# Patient Record
Sex: Female | Born: 1955 | Race: Black or African American | Hispanic: No | Marital: Single | Smoking: Never smoker
Health system: Southern US, Community
[De-identification: ages and names within clinical notes are randomized; demographics above are authoritative.]

## PROBLEM LIST (undated history)

## (undated) DIAGNOSIS — E785 Hyperlipidemia, unspecified: Secondary | ICD-10-CM

## (undated) DIAGNOSIS — I2510 Atherosclerotic heart disease of native coronary artery without angina pectoris: Secondary | ICD-10-CM

## (undated) DIAGNOSIS — I252 Old myocardial infarction: Secondary | ICD-10-CM

## (undated) DIAGNOSIS — I10 Essential (primary) hypertension: Secondary | ICD-10-CM

## (undated) HISTORY — PX: TUBAL LIGATION: SHX77

---

## 2008-09-21 ENCOUNTER — Emergency Department (HOSPITAL_BASED_OUTPATIENT_CLINIC_OR_DEPARTMENT_OTHER): Admission: EM | Admit: 2008-09-21 | Discharge: 2008-09-21 | Payer: Self-pay | Admitting: Emergency Medicine

## 2008-12-03 ENCOUNTER — Emergency Department (HOSPITAL_BASED_OUTPATIENT_CLINIC_OR_DEPARTMENT_OTHER): Admission: EM | Admit: 2008-12-03 | Discharge: 2008-12-03 | Payer: Self-pay | Admitting: Emergency Medicine

## 2008-12-03 ENCOUNTER — Ambulatory Visit: Payer: Self-pay | Admitting: Interventional Radiology

## 2010-11-25 LAB — DIFFERENTIAL
Basophils Absolute: 0 10*3/uL (ref 0.0–0.1)
Basophils Relative: 0 % (ref 0–1)
Eosinophils Absolute: 0.1 10*3/uL (ref 0.0–0.7)
Monocytes Relative: 8 % (ref 3–12)
Neutro Abs: 4 10*3/uL (ref 1.7–7.7)
Neutrophils Relative %: 73 % (ref 43–77)

## 2010-11-25 LAB — BASIC METABOLIC PANEL
BUN: 9 mg/dL (ref 6–23)
CO2: 29 mEq/L (ref 19–32)
Calcium: 9.6 mg/dL (ref 8.4–10.5)
Creatinine, Ser: 0.8 mg/dL (ref 0.4–1.2)
GFR calc Af Amer: >60 mL/min (ref >60)

## 2010-11-25 LAB — POCT TOXICOLOGY PANEL

## 2010-11-25 LAB — CBC
MCHC: 32.5 g/dL (ref 30.0–36.0)
MCV: 93.1 fL (ref 78.0–100.0)
Platelets: 167 10*3/uL (ref 150–400)
RBC: 4.24 MIL/uL (ref 3.87–5.11)

## 2010-11-25 LAB — URINALYSIS, ROUTINE W REFLEX MICROSCOPIC
Bilirubin Urine: NEGATIVE
Glucose, UA: NEGATIVE mg/dL
Hgb urine dipstick: NEGATIVE
Ketones, ur: NEGATIVE mg/dL
Protein, ur: NEGATIVE mg/dL
Urobilinogen, UA: 0.2 mg/dL (ref 0.0–1.0)

## 2010-11-25 LAB — GLUCOSE, CAPILLARY: Glucose-Capillary: 82 mg/dL (ref 70–99)

## 2010-11-25 LAB — POCT CARDIAC MARKERS
CKMB, poc: <1 ng/mL — ABNORMAL LOW (ref 1.0–8.0)
Myoglobin, poc: 74.6 ng/mL (ref 12–200)

## 2013-08-15 ENCOUNTER — Emergency Department (HOSPITAL_BASED_OUTPATIENT_CLINIC_OR_DEPARTMENT_OTHER)
Admission: EM | Admit: 2013-08-15 | Discharge: 2013-08-16 | Disposition: A | Discharge status: Home / Self Care | Payer: No Typology Code available for payment source | Attending: Emergency Medicine | Admitting: Emergency Medicine

## 2013-08-15 ENCOUNTER — Encounter (HOSPITAL_BASED_OUTPATIENT_CLINIC_OR_DEPARTMENT_OTHER): Payer: Self-pay | Admitting: Emergency Medicine

## 2013-08-15 DIAGNOSIS — M5380 Other specified dorsopathies, site unspecified: Secondary | ICD-10-CM | POA: Insufficient documentation

## 2013-08-15 DIAGNOSIS — E785 Hyperlipidemia, unspecified: Secondary | ICD-10-CM | POA: Insufficient documentation

## 2013-08-15 DIAGNOSIS — I252 Old myocardial infarction: Secondary | ICD-10-CM | POA: Insufficient documentation

## 2013-08-15 DIAGNOSIS — Y9389 Activity, other specified: Secondary | ICD-10-CM | POA: Insufficient documentation

## 2013-08-15 DIAGNOSIS — T148XXA Other injury of unspecified body region, initial encounter: Secondary | ICD-10-CM

## 2013-08-15 DIAGNOSIS — IMO0002 Reserved for concepts with insufficient information to code with codable children: Secondary | ICD-10-CM | POA: Insufficient documentation

## 2013-08-15 DIAGNOSIS — Y92410 Unspecified street and highway as the place of occurrence of the external cause: Secondary | ICD-10-CM | POA: Insufficient documentation

## 2013-08-15 DIAGNOSIS — I2510 Atherosclerotic heart disease of native coronary artery without angina pectoris: Secondary | ICD-10-CM | POA: Insufficient documentation

## 2013-08-15 DIAGNOSIS — I10 Essential (primary) hypertension: Secondary | ICD-10-CM | POA: Insufficient documentation

## 2013-08-15 DIAGNOSIS — Z79899 Other long term (current) drug therapy: Secondary | ICD-10-CM | POA: Insufficient documentation

## 2013-08-15 HISTORY — DX: Old myocardial infarction: I25.2

## 2013-08-15 HISTORY — DX: Essential (primary) hypertension: I10

## 2013-08-15 HISTORY — DX: Atherosclerotic heart disease of native coronary artery without angina pectoris: I25.10

## 2013-08-15 HISTORY — DX: Hyperlipidemia, unspecified: E78.5

## 2013-08-15 MED ORDER — TRAMADOL HCL 50 MG PO TABS
50.0000 mg | ORAL_TABLET | Freq: Once | ORAL | Status: AC
  Administered 2013-08-15: 50 mg via ORAL
  Filled 2013-08-15: qty 1

## 2013-08-15 MED ORDER — METHOCARBAMOL 500 MG PO TABS
1000.0000 mg | ORAL_TABLET | Freq: Once | ORAL | Status: AC
  Administered 2013-08-15: 1000 mg via ORAL
  Filled 2013-08-15: qty 2

## 2013-08-15 MED ORDER — METHOCARBAMOL 500 MG PO TABS: 500.0000 mg | ORAL_TABLET | Freq: Two times a day (BID) | ORAL | Status: DC

## 2013-08-15 MED ORDER — TRAMADOL HCL 50 MG PO TABS: 50.0000 mg | ORAL_TABLET | Freq: Four times a day (QID) | ORAL | Status: DC | PRN
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
## 2013-08-16 MED ORDER — TRAMADOL HCL 50 MG PO TABS: 50.0000 mg | ORAL_TABLET | Freq: Four times a day (QID) | ORAL | Status: DC | PRN

## 2013-08-16 MED ORDER — METHOCARBAMOL 500 MG PO TABS: 500.0000 mg | ORAL_TABLET | Freq: Two times a day (BID) | ORAL | Status: DC

## 2013-11-30 ENCOUNTER — Emergency Department (HOSPITAL_COMMUNITY)
Admission: EM | Admit: 2013-11-30 | Discharge: 2013-11-30 | Disposition: A | Discharge status: Home / Self Care | Payer: Federal, State, Local not specified - PPO | Attending: Emergency Medicine | Admitting: Emergency Medicine

## 2013-11-30 ENCOUNTER — Emergency Department (HOSPITAL_COMMUNITY): Payer: Federal, State, Local not specified - PPO

## 2013-11-30 ENCOUNTER — Encounter (HOSPITAL_COMMUNITY): Payer: Self-pay | Admitting: Emergency Medicine

## 2013-11-30 DIAGNOSIS — R42 Dizziness and giddiness: Secondary | ICD-10-CM | POA: Insufficient documentation

## 2013-11-30 DIAGNOSIS — Z862 Personal history of diseases of the blood and blood-forming organs and certain disorders involving the immune mechanism: Secondary | ICD-10-CM | POA: Insufficient documentation

## 2013-11-30 DIAGNOSIS — Z79899 Other long term (current) drug therapy: Secondary | ICD-10-CM | POA: Insufficient documentation

## 2013-11-30 DIAGNOSIS — Z8639 Personal history of other endocrine, nutritional and metabolic disease: Secondary | ICD-10-CM | POA: Insufficient documentation

## 2013-11-30 DIAGNOSIS — I10 Essential (primary) hypertension: Secondary | ICD-10-CM | POA: Insufficient documentation

## 2013-11-30 DIAGNOSIS — R0602 Shortness of breath: Secondary | ICD-10-CM | POA: Insufficient documentation

## 2013-11-30 DIAGNOSIS — I252 Old myocardial infarction: Secondary | ICD-10-CM | POA: Insufficient documentation

## 2013-11-30 DIAGNOSIS — I2510 Atherosclerotic heart disease of native coronary artery without angina pectoris: Secondary | ICD-10-CM | POA: Insufficient documentation

## 2013-11-30 LAB — CBC WITH DIFFERENTIAL/PLATELET
Basophils Absolute: 0 10*3/uL (ref 0.0–0.1)
Basophils Relative: 0 % (ref 0–1)
EOS ABS: 0.1 10*3/uL (ref 0.0–0.7)
Eosinophils Relative: 2 % (ref 0–5)
HCT: 36.4 % (ref 36.0–46.0)
HEMOGLOBIN: 11.8 g/dL — AB (ref 12.0–15.0)
LYMPHS ABS: 1.3 10*3/uL (ref 0.7–4.0)
Lymphocytes Relative: 24 % (ref 12–46)
MCH: 29.1 pg (ref 26.0–34.0)
MCHC: 32.4 g/dL (ref 30.0–36.0)
MCV: 89.7 fL (ref 78.0–100.0)
MONOS PCT: 7 % (ref 3–12)
Monocytes Absolute: 0.4 10*3/uL (ref 0.1–1.0)
NEUTROS PCT: 67 % (ref 43–77)
Neutro Abs: 3.7 10*3/uL (ref 1.7–7.7)
Platelets: 231 10*3/uL (ref 150–400)
RBC: 4.06 MIL/uL (ref 3.87–5.11)
RDW: 13.9 % (ref 11.5–15.5)
WBC: 5.5 10*3/uL (ref 4.0–10.5)

## 2013-11-30 LAB — BASIC METABOLIC PANEL
BUN: 14 mg/dL (ref 6–23)
CO2: 22 meq/L (ref 19–32)
Calcium: 9.5 mg/dL (ref 8.4–10.5)
Chloride: 105 mEq/L (ref 96–112)
Creatinine, Ser: 0.68 mg/dL (ref 0.50–1.10)
GFR calc Af Amer: >90 mL/min (ref >90)
GLUCOSE: 105 mg/dL — AB (ref 70–99)
POTASSIUM: 3.8 meq/L (ref 3.7–5.3)
Sodium: 143 mEq/L (ref 137–147)

## 2013-11-30 LAB — I-STAT TROPONIN, ED
TROPONIN I, POC: 0 ng/mL (ref 0.00–0.08)
Troponin i, poc: 0 ng/mL (ref 0.00–0.08)

## 2013-11-30 MED ORDER — ASPIRIN 81 MG PO CHEW: 243.0000 mg | CHEWABLE_TABLET | Freq: Once | ORAL | Status: DC

## 2013-11-30 NOTE — ED Notes (Signed)
Phlebotomy stick attempted in right hand without success.

## 2013-11-30 NOTE — ED Notes (Signed)
PA at bedside.

## 2013-11-30 NOTE — ED Notes (Signed)
Per ems, pt was sitting at work, had sudden onset of SOB. Dizziness when standing. Pt hax hx of asthma. Pt used inhaler twice. Upon ems arrival, symptoms have resolved. Pt deneis CP. Pt currently denies SOB at this time. Denies pain. Pt hx of MI, states concern for similar symptoms. Pt in NAD, symoptom free upon arrival.

## 2013-11-30 NOTE — Discharge Instructions (Signed)
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
## 2013-11-30 NOTE — ED Notes (Signed)
Phlebotomy called.  

## 2013-12-02 NOTE — ED Provider Notes (Signed)
Medical screening examination/treatment/procedure(s) were performed by non-physician practitioner and as supervising physician I was immediately available for consultation/collaboration.   EKG Interpretation   Date/Time:  Friday November 30 2013 18:37:30 EDT Ventricular Rate:  75 PR Interval:  168 QRS Duration: 77 QT Interval:  396 QTC Calculation: 442 R Axis:   21 Text Interpretation:  Sinus rhythm Ventricular premature complex ED  PHYSICIAN INTERPRETATION AVAILABLE IN [facility] Confirmed by TEST,  Record ([id number]) on 12/02/2013 1:23:41 PM        Shelda JakesScott W. Gerilyn Stargell, MD 12/02/13 1539

## 2015-02-24 ENCOUNTER — Emergency Department (HOSPITAL_COMMUNITY): Payer: Federal, State, Local not specified - PPO

## 2015-02-24 ENCOUNTER — Emergency Department (HOSPITAL_COMMUNITY)
Admission: EM | Admit: 2015-02-24 | Discharge: 2015-02-24 | Disposition: A | Discharge status: Home / Self Care | Payer: Federal, State, Local not specified - PPO | Attending: Emergency Medicine | Admitting: Emergency Medicine

## 2015-02-24 ENCOUNTER — Encounter (HOSPITAL_COMMUNITY): Payer: Self-pay | Admitting: Nurse Practitioner

## 2015-02-24 DIAGNOSIS — E785 Hyperlipidemia, unspecified: Secondary | ICD-10-CM | POA: Insufficient documentation

## 2015-02-24 DIAGNOSIS — I252 Old myocardial infarction: Secondary | ICD-10-CM | POA: Insufficient documentation

## 2015-02-24 DIAGNOSIS — Z79899 Other long term (current) drug therapy: Secondary | ICD-10-CM | POA: Insufficient documentation

## 2015-02-24 DIAGNOSIS — Z7982 Long term (current) use of aspirin: Secondary | ICD-10-CM | POA: Insufficient documentation

## 2015-02-24 DIAGNOSIS — I10 Essential (primary) hypertension: Secondary | ICD-10-CM | POA: Insufficient documentation

## 2015-02-24 DIAGNOSIS — I2510 Atherosclerotic heart disease of native coronary artery without angina pectoris: Secondary | ICD-10-CM | POA: Diagnosis not present

## 2015-02-24 DIAGNOSIS — R202 Paresthesia of skin: Secondary | ICD-10-CM | POA: Diagnosis not present

## 2015-02-24 DIAGNOSIS — R200 Anesthesia of skin: Secondary | ICD-10-CM | POA: Diagnosis present

## 2015-02-24 LAB — CBC
HEMATOCRIT: 39.6 % (ref 36.0–46.0)
Hemoglobin: 12.9 g/dL (ref 12.0–15.0)
MCH: 28.6 pg (ref 26.0–34.0)
MCHC: 32.6 g/dL (ref 30.0–36.0)
MCV: 87.8 fL (ref 78.0–100.0)
Platelets: 214 10*3/uL (ref 150–400)
RBC: 4.51 MIL/uL (ref 3.87–5.11)
RDW: 13.3 % (ref 11.5–15.5)
WBC: 5 10*3/uL (ref 4.0–10.5)

## 2015-02-24 LAB — BASIC METABOLIC PANEL
Anion gap: 10 (ref 5–15)
BUN: 12 mg/dL (ref 6–20)
CALCIUM: 9.5 mg/dL (ref 8.9–10.3)
CO2: 25 mmol/L (ref 22–32)
Chloride: 107 mmol/L (ref 101–111)
Creatinine, Ser: 0.73 mg/dL (ref 0.44–1.00)
GFR calc Af Amer: >60 mL/min (ref >60)
GFR calc non Af Amer: >60 mL/min (ref >60)
GLUCOSE: 89 mg/dL (ref 65–99)
Potassium: 4 mmol/L (ref 3.5–5.1)
Sodium: 142 mmol/L (ref 135–145)

## 2015-02-24 LAB — I-STAT TROPONIN, ED: Troponin i, poc: 0 ng/mL (ref 0.00–0.08)

## 2015-02-24 NOTE — Discharge Instructions (Signed)
Start taking your vitamin B12 tablets again. Stay well hydrated. Follow up with your regular doctor in 3-5 days for recheck of ongoing symptoms. Return to the ER for changes or worsening symptoms.   Paresthesia Paresthesia is an abnormal burning or prickling sensation. This sensation is generally felt in the hands, arms, legs, or feet. However, it may occur in any part of the body. It is usually not painful. The feeling may be described as:  Tingling or numbness.  "Pins and needles."  Skin crawling.  Buzzing.  Limbs "falling asleep."  Itching. Most people experience temporary (transient) paresthesia at some time in their lives. CAUSES  Paresthesia may occur when you breathe too quickly (hyperventilation). It can also occur without any apparent cause. Commonly, paresthesia occurs when pressure is placed on a nerve. The feeling quickly goes away once the pressure is removed. For some people, however, paresthesia is a long-lasting (chronic) condition caused by an underlying disorder. The underlying disorder may be:  A traumatic, direct injury to nerves. Examples include a:  Broken (fractured) neck.  Fractured skull.  A disorder affecting the brain and spinal cord (central nervous system). Examples include:  Transverse myelitis.  Encephalitis.  Transient ischemic attack.  Multiple sclerosis.  Stroke.  Tumor or blood vessel problems, such as an arteriovenous malformation pressing against the brain or spinal cord.  A condition that damages the peripheral nerves (peripheral neuropathy). Peripheral nerves are not part of the brain and spinal cord. These conditions include:  Diabetes.  Peripheral vascular disease.  Nerve entrapment syndromes, such as carpal tunnel syndrome.  Shingles.  Hypothyroidism.  Vitamin B12 deficiencies.  Alcoholism.  Heavy metal poisoning (lead, arsenic).  Rheumatoid arthritis.  Systemic lupus erythematosus. DIAGNOSIS  Your caregiver will  attempt to find the underlying cause of your paresthesia. Your caregiver may:  Take your medical history.  Perform a physical exam.  Order various lab tests.  Order imaging tests. TREATMENT  Treatment for paresthesia depends on the underlying cause. HOME CARE INSTRUCTIONS  Avoid drinking alcohol.  You may consider massage or acupuncture to help relieve your symptoms.  Keep all follow-up appointments as directed by your caregiver. SEEK IMMEDIATE MEDICAL CARE IF:   You feel weak.  You have trouble walking or moving.  You have problems with speech or vision.  You feel confused.  You cannot control your bladder or bowel movements.  You feel numbness after an injury.  You faint.  Your burning or prickling feeling gets worse when walking.  You have pain, cramps, or dizziness.  You develop a rash. MAKE SURE YOU:  Understand these instructions.  Will watch your condition.  Will get help right away if you are not doing well or get worse. Document Released: 07/23/2002 Document Revised: 10/25/2011 Document Reviewed: 04/23/2011 [facility] Patient Information 2015 [place], [place]. This information is not intended to replace advice given to you by your health care provider. Make sure you discuss any questions you have with your health care provider.  Peripheral Neuropathy Peripheral neuropathy is a type of nerve damage. It affects nerves that carry signals between the spinal cord and other parts of the body. These are called peripheral nerves. With peripheral neuropathy, one nerve or a group of nerves may be damaged.  CAUSES  Many things can damage peripheral nerves. For some people with peripheral neuropathy, the cause is unknown. Some causes include:  Diabetes. This is the most common cause of peripheral neuropathy.  Injury to a nerve.  Pressure or stress on a nerve that  lasts a long time.  Too little vitamin B. Alcoholism can lead to this.  Infections.  Autoimmune  diseases, such as multiple sclerosis and systemic lupus erythematosus.  Inherited nerve diseases.  Some medicines, such as cancer drugs.  Toxic substances, such as lead and mercury.  Too little blood flowing to the legs.  Kidney disease.  Thyroid disease. SIGNS AND SYMPTOMS  Different people have different symptoms. The symptoms you have will depend on which of your nerves is damaged. Common symptoms include:  Loss of feeling (numbness) in the feet and hands.  Tingling in the feet and hands.  Pain that burns.  Very sensitive skin.  Weakness.  Not being able to move a part of the body (paralysis).  Muscle twitching.  Clumsiness or poor coordination.  Loss of balance.  Not being able to control your bladder.  Feeling dizzy.  Sexual problems. DIAGNOSIS  Peripheral neuropathy is a symptom, not a disease. Finding the cause of peripheral neuropathy can be hard. To figure that out, your health care provider will take a medical history and do a physical exam. A neurological exam will also be done. This involves checking things affected by your brain, spinal cord, and nerves (nervous system). For example, your health care provider will check your reflexes, how you move, and what you can feel.  Other types of tests may also be ordered, such as:  Blood tests.  A test of the fluid in your spinal cord.  Imaging tests, such as CT scans or an MRI.  Electromyography (EMG). This test checks the nerves that control muscles.  Nerve conduction velocity tests. These tests check how fast messages pass through your nerves.  Nerve biopsy. A small piece of nerve is removed. It is then checked under a microscope. TREATMENT   Medicine is often used to treat peripheral neuropathy. Medicines may include:  Pain-relieving medicines. Prescription or over-the-counter medicine may be suggested.  Antiseizure medicine. This may be used for pain.  Antidepressants. These also may help ease  pain from neuropathy.  Lidocaine. This is a numbing medicine. You might wear a patch or be given a shot.  Mexiletine. This medicine is typically used to help control irregular heart rhythms.  Surgery. Surgery may be needed to relieve pressure on a nerve or to destroy a nerve that is causing pain.  Physical therapy to help movement.  Assistive devices to help movement. HOME CARE INSTRUCTIONS   Only take over-the-counter or prescription medicines as directed by your health care provider. Follow the instructions carefully for any given medicines. Do not take any other medicines without first getting approval from your health care provider.  If you have diabetes, work closely with your health care provider to keep your blood sugar under control.  If you have numbness in your feet:  Check every day for signs of injury or infection. Watch for redness, warmth, and swelling.  Wear padded socks and comfortable shoes. These help protect your feet.  Do not do things that put pressure on your damaged nerve.  Do not smoke. Smoking keeps blood from getting to damaged nerves.  Avoid or limit alcohol. Too much alcohol can cause a lack of B vitamins. These vitamins are needed for healthy nerves.  Develop a good support system. Coping with peripheral neuropathy can be stressful. Talk to a mental health specialist or join a support group if you are struggling.  Follow up with your health care provider as directed. SEEK MEDICAL CARE IF:   You have new  signs or symptoms of peripheral neuropathy.  You are struggling emotionally from dealing with peripheral neuropathy.  You have a fever. SEEK IMMEDIATE MEDICAL CARE IF:   You have an injury or infection that is not healing.  You feel very dizzy or begin vomiting.  You have chest pain.  You have trouble breathing. Document Released: 07/23/2002 Document Revised: 04/14/2011 Document Reviewed: 04/09/2013 [facility] Patient Information 2015  [place], [place]. This information is not intended to replace advice given to you by your health care provider. Make sure you discuss any questions you have with your health care provider.

## 2015-02-24 NOTE — ED Notes (Signed)
Pt. Ambulated independently in hallway with steady gait, no complaints of pain or dizziness.

## 2015-02-24 NOTE — ED Notes (Signed)
Pt states she woke this am with tingling sensation in L hand and BLE, the sensation has persisted through the day. She denies pain now. She did have lower back pain last night before going to bed. She had lower back pain with MI in the past. She is A&Ox4, resp e/u

## 2015-02-24 NOTE — ED Notes (Signed)
PA Mercedes at bedside with patient and family.

## 2015-02-24 NOTE — ED Provider Notes (Signed)
CSN: [id number]     Arrival date & time 02/24/15  1233 History   First MD Initiated Contact with Patient 02/24/15 1307     Chief Complaint  Patient presents with  . Numbness     (Consider location/radiation/quality/duration/timing/severity/associated sxs/prior Treatment) HPI Comments: Kristin Schneider is a 59 y.o. female with a PMHx of CAD, MI, HLD, and HTN, who presents to the ED with complaints of bilateral lower extremity tingling and left finger tip paresthesia with a "funny feeling" in her left face that began upon awakening this morning around 5 AM. She states that last night she had some low back pain which resolved during the course of the night, but she awoke with the new symptoms. She denies any fevers, chills, chest pain, shortness breath, cough, leg swelling, recent travel/surgery/immobilization, history of DVT/PE, abdominal pain, nausea, vomiting, diarrhea, constipation, obstipation, melena, hematochezia, dysuria, hematuria, perianal numbness or tingling, cauda equina symptoms, incontinence of urine or stool, focal weakness, diaphoresis, lightheadedness, headache, or vision changes. She is a nonsmoker. No recent changes in medications but she admits that she hasn't been compliant with her vitamin B12.  Patient is a 59 y.o. female presenting with general illness. The history is provided by the patient. No language interpreter was used.  Illness Location:  BLE tingling, L fingertips paresthesia, and "funny feeling" in L face Quality:  Tingling/paresthesia Severity:  Moderate Onset quality:  Gradual Duration:  8 hours Timing:  Constant Progression:  Unchanged Chronicity:  New Context:  Awoke with symptoms Relieved by:  None tried Worsened by:  None tried Ineffective treatments:  None tried Associated symptoms: no abdominal pain, no chest pain, no cough, no diarrhea, no fever, no headaches, no myalgias, no nausea, no shortness of breath and no vomiting     Past Medical History   Diagnosis Date  . Coronary artery disease   . MI, old   . Hyperlipemia   . Hypertension    Past Surgical History  Procedure Laterality Date  . Tubal ligation     History reviewed. No pertinent family history. History  Substance Use Topics  . Smoking status: Never Smoker   . Smokeless tobacco: Not on file  . Alcohol Use: No   OB History    No data available     Review of Systems  Constitutional: Negative for fever, chills and diaphoresis.  Eyes: Negative for visual disturbance.  Respiratory: Negative for cough and shortness of breath.   Cardiovascular: Negative for chest pain and leg swelling.  Gastrointestinal: Negative for nausea, vomiting, abdominal pain, diarrhea, constipation and blood in stool.  Genitourinary: Negative for dysuria and hematuria.  Musculoskeletal: Negative for myalgias, back pain, arthralgias and neck pain.  Skin: Negative for color change.  Allergic/Immunologic: Negative for immunocompromised state.  Neurological: Negative for syncope, facial asymmetry, weakness, light-headedness, numbness and headaches.       +BLE tingling +L fingertip tingling/paresthesia +"funny feeling" in L face  Psychiatric/Behavioral: Negative for confusion.   10 Systems reviewed and are negative for acute change except as noted in the HPI.    Allergies  Review of patient's allergies indicates no known allergies.  Home Medications   Prior to Admission medications   Medication Sig Start Date End Date Taking? Authorizing Provider  albuterol (PROVENTIL HFA;VENTOLIN HFA) 108 (90 BASE) MCG/ACT inhaler Inhale 2 puffs into the lungs every 6 (six) hours as needed for wheezing or shortness of breath.    Historical Provider, MD  aspirin EC 81 MG tablet Take 81 mg by mouth daily.  Historical Provider, MD  CALCIUM PO Take 1 tablet by mouth daily.    Historical Provider, MD  Cholecalciferol (VITAMIN D PO) Take 1 tablet by mouth daily.    Historical Provider, MD  Cyanocobalamin  (VITAMIN B 12 PO) Take 1 tablet by mouth daily.    Historical Provider, MD  ferrous sulfate 325 (65 FE) MG tablet Take 325 mg by mouth daily with breakfast.    Historical Provider, MD  LECITHIN PO Take 1 capsule by mouth daily.    Historical Provider, MD  losartan (COZAAR) 25 MG tablet Take 25 mg by mouth daily.    Historical Provider, MD  losartan (COZAAR) 25 MG tablet Take 25 mg by mouth daily. 09/04/13   Historical Provider, MD  metoprolol (LOPRESSOR) 50 MG tablet Take 50 mg by mouth 2 (two) times daily.    Historical Provider, MD  Multiple Vitamin (MULTIVITAMIN WITH MINERALS) TABS tablet Take 1 tablet by mouth daily.    Historical Provider, MD  PRESCRIPTION MEDICATION Take 1 tablet by mouth daily.    Historical Provider, MD   BP 159/87 mmHg  Pulse 85  Temp(Src) 99 F (37.2 C) (Oral)  Resp 16  Ht  (1.549 m)  Wt 180 lb (81.647 kg)  BMI 34.03 kg/m2  SpO2 99% Physical Exam  Constitutional: She is oriented to person, place, and time. Vital signs are normal. She appears well-developed and well-nourished.  Non-toxic appearance. No distress.  Afebrile, nontoxic, NAD  HENT:  Head: Normocephalic and atraumatic.  Mouth/Throat: Oropharynx is clear and moist and mucous membranes are normal.  No facial asymmetry  Eyes: Conjunctivae and EOM are normal. Pupils are equal, round, and reactive to light. Right eye exhibits no discharge. Left eye exhibits no discharge.  PERRL, EOMI, no nystagmus, no visual field deficits   Neck: Normal range of motion. Neck supple. No spinous process tenderness and no muscular tenderness present. No rigidity. Normal range of motion present.  FROM intact without spinous process TTP, no bony stepoffs or deformities, no paraspinous muscle TTP or muscle spasms. No rigidity or meningeal signs. No bruising or swelling.   Cardiovascular: Normal rate, regular rhythm, normal heart sounds and intact distal pulses.  Exam reveals no gallop and no friction rub.   No murmur  heard. RRR, nl s1/s2, no m/r/g, distal pulses intact, no pedal edema   Pulmonary/Chest: Effort normal and breath sounds normal. No respiratory distress. She has no decreased breath sounds. She has no wheezes. She has no rhonchi. She has no rales.  Abdominal: Soft. Normal appearance and bowel sounds are normal. She exhibits no distension. There is no tenderness. There is no rigidity, no rebound, no guarding, no CVA tenderness, no tenderness at McBurney's point and negative Murphy's sign.  Musculoskeletal: Normal range of motion.  MAE x4 Strength grossly intact Sensation grossly intact although she states it's "different" sensation in L fingertips Distal pulses intact No pedal edema Gait steady  Neurological: She is alert and oriented to person, place, and time. She has normal strength. No cranial nerve deficit or sensory deficit. She displays a negative Romberg sign. Coordination and gait normal. GCS eye subscore is 4. GCS verbal subscore is 5. GCS motor subscore is 6.  CN 2-12 grossly intact A&O x4 GCS 15 Sensation and strength intact although she states it's "different" in L fingertips Gait nonataxic Coordination with finger-to-nose WNL Neg romberg, neg pronator drift   Skin: Skin is warm, dry and intact. No rash noted.  Psychiatric: She has a normal mood and  affect.  Nursing note and vitals reviewed.   ED Course  Procedures (including critical care time) Labs Review Labs Reviewed  BASIC METABOLIC PANEL  CBC  I-STAT TROPOININ, ED    Imaging Review Dg Chest 2 View  02/24/2015   CLINICAL DATA:  Chest pain with left hand numbness and tingling since this morning.  EXAM: CHEST  2 VIEW  COMPARISON:  11/30/2013  FINDINGS: The heart size and mediastinal contours are within normal limits. Both lungs are clear except for a tiny area of linear scarring in the left lung base laterally. The visualized skeletal structures are unremarkable.  IMPRESSION: No active cardiopulmonary disease.    Electronically Signed   By: Francene BoyersJames  Maxwell M.D.   On: 02/24/2015 13:46     EKG Interpretation   Date/Time:  Monday February 24 2015 12:44:16 EDT Ventricular Rate:  87 PR Interval:  150 QRS Duration: 72 QT Interval:  374 QTC Calculation: 450 R Axis:   39 Text Interpretation:  Normal sinus rhythm Septal infarct , age  undetermined Abnormal ECG since last tracing no significant change  Confirmed by BELFI  MD, MELANIE ([id number]) on 02/24/2015 2:31:13 PM      MDM   Final diagnoses:  Left hand paresthesia  Tingling sensation    59 y.o. female here with BLE tingling and L fingertip paresthesia as well as L facial "funny feeling" but unable to describe it fully. Neurologically intact without focal deficits, but states the L fingertips feel different. Gait steady. No CP/SOB complaint. No back pain and all spinal levels nonTTP. Will get labs and EKG. CXR obtained from triage although pt not complaining of CP/SOB. Will reassess shortly.   2:29 PM EKG unremarkable. CXR clear. Trop neg, BMP and CBC WNL. Discussed that vitamin B12 deficiency could cause her symptoms, advised her to take that again. Discussed that her presentation is not completely typical of stroke, doubt need for CT here, but MRI could be indicated if her symptoms persist to r/o remote CVA vs MS, etc. Discussed close f/up with her PCP in 3-5 days. I explained the diagnosis and have given explicit precautions to return to the ER including for any other new or worsening symptoms. The patient understands and accepts the medical plan as it's been dictated and I have answered their questions. Discharge instructions concerning home care and prescriptions have been given. The patient is STABLE and is discharged to home in good condition.  BP 122/84 mmHg  Pulse 84  Temp(Src) 99 F (37.2 C) (Oral)  Resp 14  Ht 5\' 1"  (1.549 m)  Wt 180 lb (81.647 kg)  BMI 34.03 kg/m2  SpO2 100%      Ossiel Marchio Camprubi-Soms, PA-C 02/24/15 1435  Rolan BuccoMelanie  Belfi, MD 02/24/15 1530

## 2015-02-24 NOTE — ED Notes (Signed)
Patient transported to X-ray 

## 2017-01-29 IMAGING — DX DG CHEST 2V
2 series · 2 of 2 positions shown · non-contrast
Comparison: 11/30/2013

CLINICAL DATA: Chest pain with left hand numbness and tingling
since this morning.

EXAM:
CHEST  2 VIEW

[w chest pa]
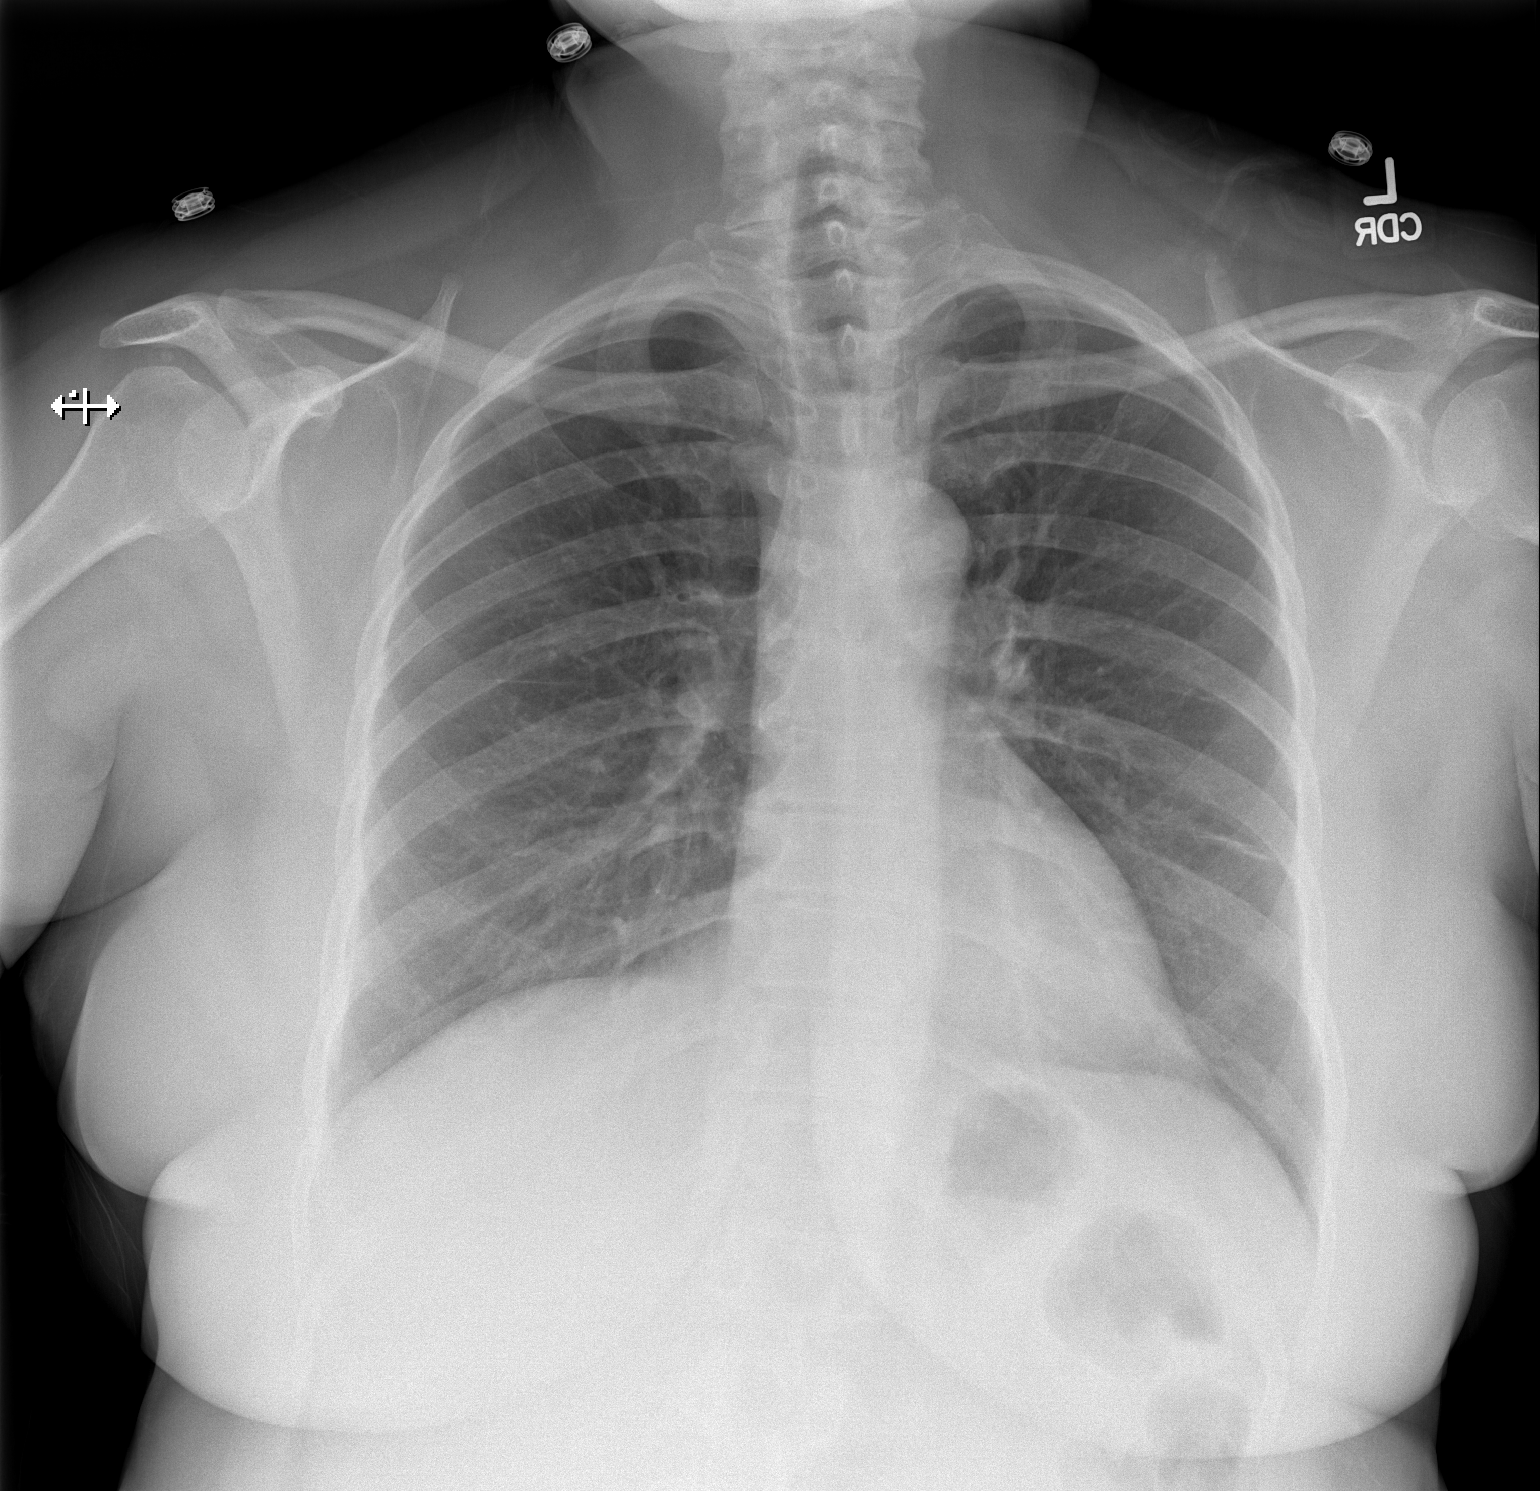

[w chest lat]
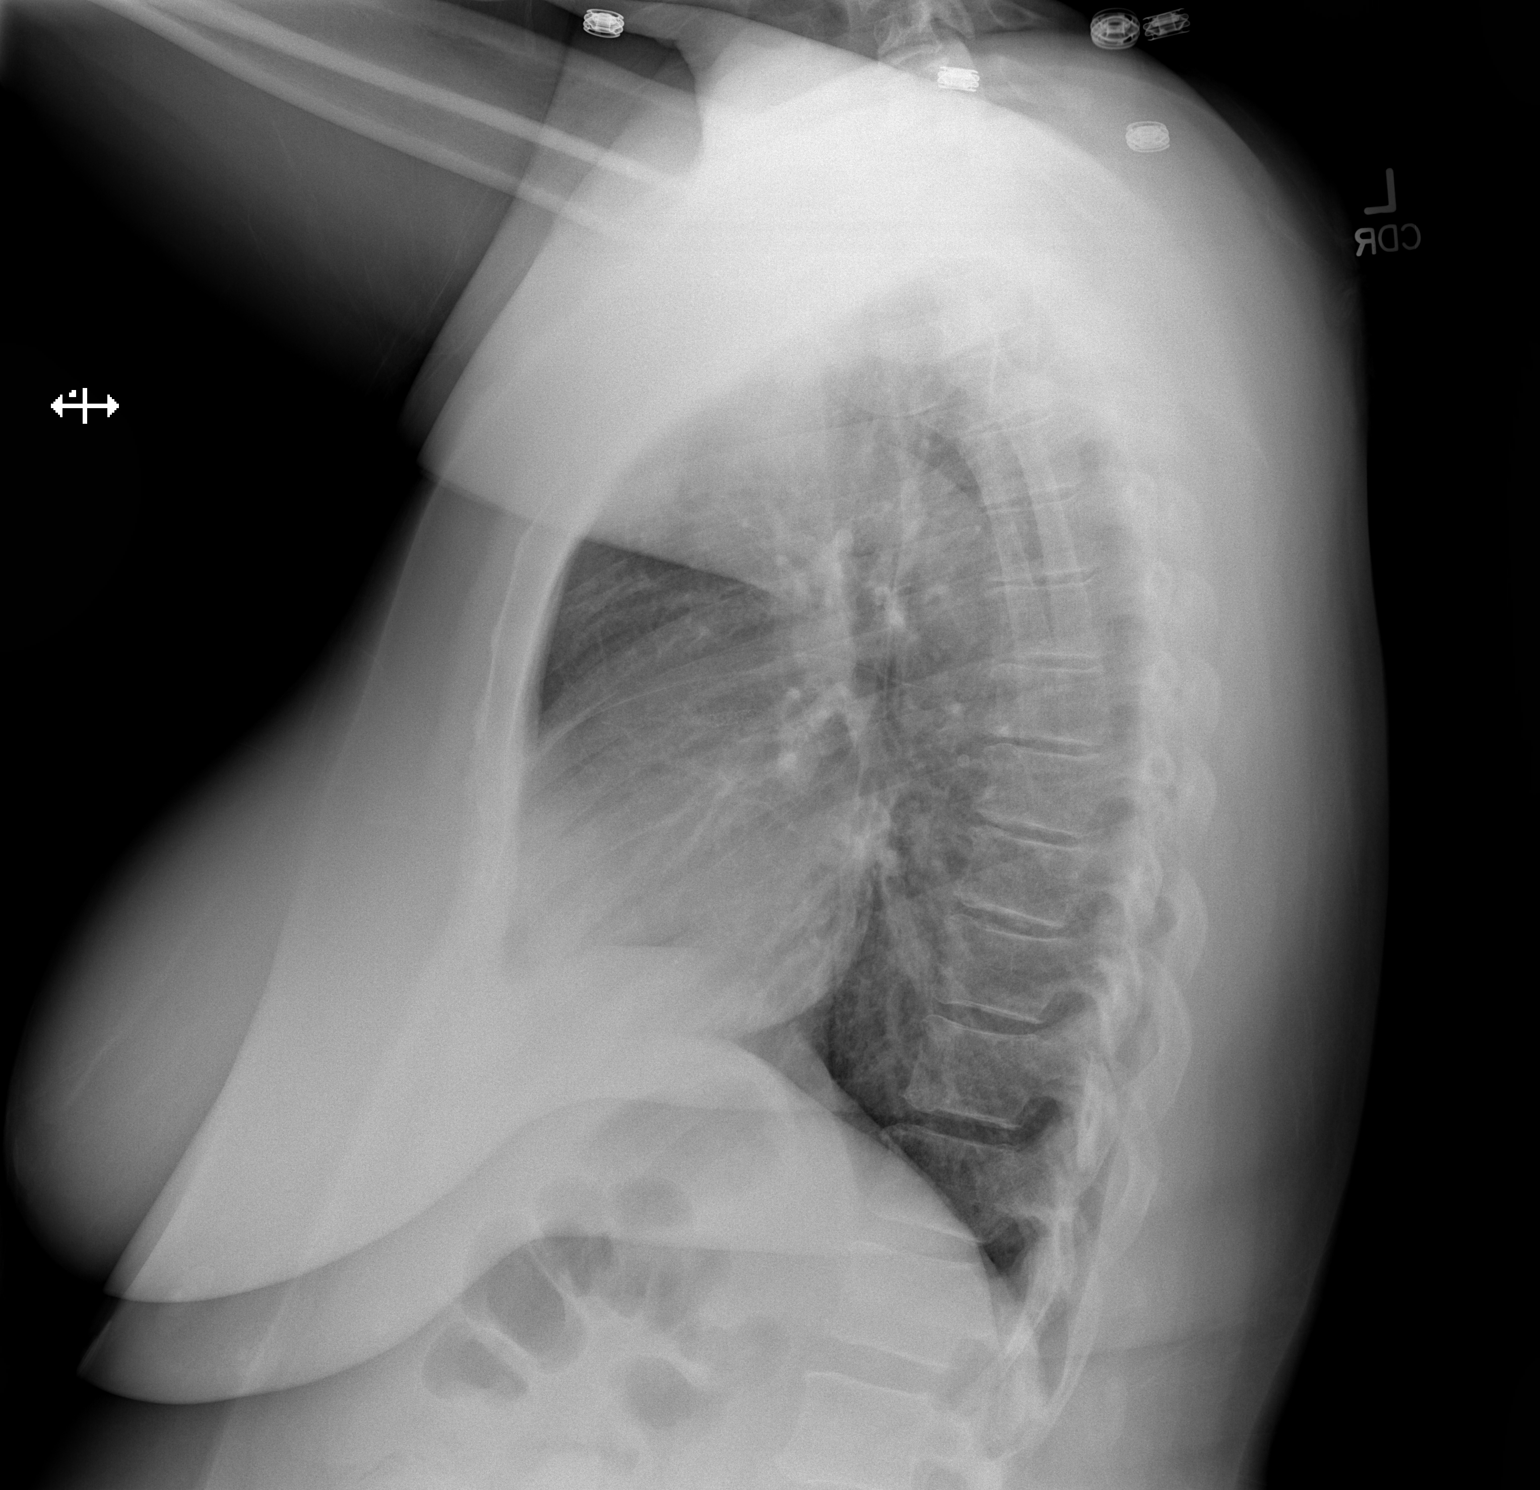

[2 of 2 positions shown; findings below may reference images not displayed]

FINDINGS: The heart size and mediastinal contours are within normal limits.
Both lungs are clear except for a tiny area of linear scarring in
the left lung base laterally. The visualized skeletal structures are
unremarkable.
IMPRESSION: No active cardiopulmonary disease.
